# Patient Record
Sex: Male | Born: 1985 | Race: White | Hispanic: No | Marital: Married | Smoking: Never smoker
Health system: Southern US, Community
[De-identification: ages and names within clinical notes are randomized; demographics above are authoritative.]

## PROBLEM LIST (undated history)

## (undated) DIAGNOSIS — R569 Unspecified convulsions: Secondary | ICD-10-CM

---

## 2014-07-16 ENCOUNTER — Encounter (HOSPITAL_COMMUNITY): Payer: Self-pay | Admitting: Emergency Medicine

## 2014-07-16 ENCOUNTER — Emergency Department (HOSPITAL_COMMUNITY): Payer: Managed Care, Other (non HMO)

## 2014-07-16 ENCOUNTER — Emergency Department (HOSPITAL_COMMUNITY)
Admission: EM | Admit: 2014-07-16 | Discharge: 2014-07-16 | Disposition: A | Discharge status: Home / Self Care | Payer: Managed Care, Other (non HMO) | Attending: Emergency Medicine | Admitting: Emergency Medicine

## 2014-07-16 DIAGNOSIS — X58XXXA Exposure to other specified factors, initial encounter: Secondary | ICD-10-CM | POA: Diagnosis not present

## 2014-07-16 DIAGNOSIS — Y929 Unspecified place or not applicable: Secondary | ICD-10-CM | POA: Insufficient documentation

## 2014-07-16 DIAGNOSIS — Y999 Unspecified external cause status: Secondary | ICD-10-CM | POA: Diagnosis not present

## 2014-07-16 DIAGNOSIS — S335XXA Sprain of ligaments of lumbar spine, initial encounter: Secondary | ICD-10-CM

## 2014-07-16 DIAGNOSIS — Y939 Activity, unspecified: Secondary | ICD-10-CM | POA: Diagnosis not present

## 2014-07-16 DIAGNOSIS — Z79899 Other long term (current) drug therapy: Secondary | ICD-10-CM | POA: Diagnosis not present

## 2014-07-16 DIAGNOSIS — G40909 Epilepsy, unspecified, not intractable, without status epilepticus: Secondary | ICD-10-CM | POA: Diagnosis not present

## 2014-07-16 DIAGNOSIS — R52 Pain, unspecified: Secondary | ICD-10-CM

## 2014-07-16 DIAGNOSIS — S3992XA Unspecified injury of lower back, initial encounter: Secondary | ICD-10-CM | POA: Diagnosis present

## 2014-07-16 DIAGNOSIS — S339XXA Sprain of unspecified parts of lumbar spine and pelvis, initial encounter: Secondary | ICD-10-CM | POA: Insufficient documentation

## 2014-07-16 HISTORY — DX: Unspecified convulsions: R56.9

## 2014-07-16 MED ORDER — HYDROCODONE-ACETAMINOPHEN 5-325 MG PO TABS: 2.0000 | ORAL_TABLET | ORAL | Status: DC | PRN

## 2014-07-16 MED ORDER — ONDANSETRON 4 MG PO TBDP
4.0000 mg | ORAL_TABLET | Freq: Once | ORAL | Status: AC
  Administered 2014-07-16: 4 mg via ORAL
  Filled 2014-07-16: qty 1

## 2014-07-16 MED ORDER — KETOROLAC TROMETHAMINE 60 MG/2ML IM SOLN
60.0000 mg | Freq: Once | INTRAMUSCULAR | Status: AC
  Administered 2014-07-16: 60 mg via INTRAMUSCULAR
  Filled 2014-07-16: qty 2

## 2014-07-16 MED ORDER — IBUPROFEN 800 MG PO TABS: 800.0000 mg | ORAL_TABLET | Freq: Three times a day (TID) | ORAL | Status: AC

## 2014-07-16 MED ORDER — METHOCARBAMOL 500 MG PO TABS: 500.0000 mg | ORAL_TABLET | Freq: Two times a day (BID) | ORAL | Status: DC

## 2014-07-16 MED ORDER — HYDROMORPHONE HCL 1 MG/ML IJ SOLN
1.0000 mg | Freq: Once | INTRAMUSCULAR | Status: AC
  Administered 2014-07-16: 1 mg via INTRAMUSCULAR
  Filled 2014-07-16: qty 1

## 2014-07-16 NOTE — ED Provider Notes (Signed)
CSN: [id number]     Arrival date & time 07/16/14  1758 History  This chart was scribed for non-physician practitioner, Langston MaskerKaren Sofia, PA-C working with Jerelyn ScottMartha Linker, MD by Greggory StallionKayla Andersen, ED scribe. This patient was seen in room TR10C/TR10C and the patient's care was started at 6:44 PM.   Chief Complaint  Patient presents with  . Back Pain   The history is provided by the patient. No language interpreter was used.    HPI Comments: Frederick Rose is a 29 y.o. male who presents to the Emergency Department complaining of sudden onset lower back pain that started this morning after sneezing. Pain does not radiate into his legs. Movements worsen pain but standing relieves some of it. He has taken ibuprofen with no relief and used ice with some relief. Pt denies trouble breathing, abdominal pain, bowel or bladder incontinence, numbness or tingling, weakness. He is on keppra daily for epilepsy. Pt denies recent seizure or fall that could have caused his current back pain. He has had intermittent back pain since running track in high school but states it has never been this severe. Pt does not have a PCP.  Past Medical History  Diagnosis Date  . Seizures    History reviewed. No pertinent past surgical history. No family history on file. History  Substance Use Topics  . Smoking status: Never Smoker   . Smokeless tobacco: Not on file  . Alcohol Use: No    Review of Systems  Gastrointestinal: Negative for abdominal pain.  Genitourinary:       Negative for bowel or bladder incontinence.  Musculoskeletal: Positive for back pain.  Neurological: Negative for weakness and numbness.  All other systems reviewed and are negative.  Allergies  Lamictal  Home Medications   Prior to Admission medications   Medication Sig Start Date End Date Taking? Authorizing Provider  ibuprofen (ADVIL,MOTRIN) 200 MG tablet Take 1,000 mg by mouth every 6 (six) hours as needed.   Yes Historical Provider, MD   levETIRAcetam (KEPPRA) 750 MG tablet Take 750 mg by mouth 2 (two) times daily.   Yes Historical Provider, MD   BP 116/65 mmHg  Pulse 67  Temp(Src) 98.6 F (37 C)  Resp 16  Ht 5\' 6"  (1.676 m)  Wt 160 lb (72.576 kg)  BMI 25.84 kg/m2  SpO2 95%   Physical Exam  Constitutional: He is oriented to person, place, and time. He appears well-developed and well-nourished. No distress.  HENT:  Head: Normocephalic and atraumatic.  Eyes: Conjunctivae and EOM are normal.  Neck: Neck supple.  Cardiovascular: Normal rate.   Pulmonary/Chest: Effort normal. No respiratory distress.  Musculoskeletal: Normal range of motion.  Diffusely tender on lumbar spine. Pain with movement. Full ROM of lower extremities.   Neurological: He is alert and oriented to person, place, and time.  Skin: Skin is warm and dry.  Psychiatric: He has a normal mood and affect. His behavior is normal.  Nursing note and vitals reviewed.   ED Course  Procedures (including critical care time)  DIAGNOSTIC STUDIES: Oxygen Saturation is 95% on RA, normal by my interpretation.    COORDINATION OF CARE: 6:48 PM-Discussed treatment plan which includes pain medication and lumbar xray with pt at bedside and pt agreed to plan.   Labs Review Labs Reviewed - No data to display  Imaging Review Dg Lumbar Spine Complete  07/16/2014   CLINICAL DATA:  Sudden low back pain starting this morning after sneezing.  EXAM: LUMBAR SPINE - COMPLETE 4+ VIEW  COMPARISON:  None.  FINDINGS: There is no evidence of lumbar spine fracture. There is dextro curvature of the lumbar spine. Intervertebral disc spaces are maintained.  IMPRESSION: No acute fracture or dislocation.   Electronically Signed   By: Sherian Rein M.D.   On: 07/16/2014 19:33     EKG Interpretation None      MDM  Pt able to ambulate after torodol and pain medication.     Final diagnoses:  Pain  Lumbar sprain, initial encounter   Ibuprofen Hydrocodone robaxin  I  personally performed the services in this documentation, which was scribed in my presence.  The recorded information has been reviewed and considered.   Barnet Pall.  Lonia Skinner [place], PA-C 07/16/14 2116  Jerelyn Scott, MD 07/16/14 2130

## 2014-07-16 NOTE — ED Notes (Signed)
Pt reports that he coughed this morning and his back gave out on him. Pt c/o pain to lower back. sts when the pain comes on he colapses. No numbness, no loss of bowel or urine.

## 2014-07-16 NOTE — Discharge Instructions (Signed)
Back Pain, Adult Low back pain is very common. About 1 in 5 people have back pain.The cause of low back pain is rarely dangerous. The pain often gets better over time.About half of people with a sudden onset of back pain feel better in just 2 weeks. About 8 in 10 people feel better by 6 weeks.  CAUSES Some common causes of back pain include:  Strain of the muscles or ligaments supporting the spine.  Wear and tear (degeneration) of the spinal discs.  Arthritis.  Direct injury to the back. DIAGNOSIS Most of the time, the direct cause of low back pain is not known.However, back pain can be treated effectively even when the exact cause of the pain is unknown.Answering your caregiver's questions about your overall health and symptoms is one of the most accurate ways to make sure the cause of your pain is not dangerous. If your caregiver needs more information, he or she may order lab work or imaging tests (X-rays or MRIs).However, even if imaging tests show changes in your back, this usually does not require surgery. HOME CARE INSTRUCTIONS For many people, back pain returns.Since low back pain is rarely dangerous, it is often a condition that people can learn to manageon their own.   Remain active. It is stressful on the back to sit or stand in one place. Do not sit, drive, or stand in one place for more than 30 minutes at a time. Take short walks on level surfaces as soon as pain allows.Try to increase the length of time you walk each day.  Do not stay in bed.Resting more than 1 or 2 days can delay your recovery.  Do not avoid exercise or work.Your body is made to move.It is not dangerous to be active, even though your back may hurt.Your back will likely heal faster if you return to being active before your pain is gone.  Pay attention to your body when you bend and lift. Many people have less discomfortwhen lifting if they bend their knees, keep the load close to their bodies,and  avoid twisting. Often, the most comfortable positions are those that put less stress on your recovering back.  Find a comfortable position to sleep. Use a firm mattress and lie on your side with your knees slightly bent. If you lie on your back, put a pillow under your knees.  Only take over-the-counter or prescription medicines as directed by your caregiver. Over-the-counter medicines to reduce pain and inflammation are often the most helpful.Your caregiver may prescribe muscle relaxant drugs.These medicines help dull your pain so you can more quickly return to your normal activities and healthy exercise.  Put ice on the injured area.  Put ice in a plastic bag.  Place a towel between your skin and the bag.  Leave the ice on for 15-20 minutes, 03-04 times a day for the first 2 to 3 days. After that, ice and heat may be alternated to reduce pain and spasms.  Ask your caregiver about trying back exercises and gentle massage. This may be of some benefit.  Avoid feeling anxious or stressed.Stress increases muscle tension and can worsen back pain.It is important to recognize when you are anxious or stressed and learn ways to manage it.Exercise is a great option. SEEK MEDICAL CARE IF:  You have pain that is not relieved with rest or medicine.  You have pain that does not improve in 1 week.  You have new symptoms.  You are generally not feeling well. SEEK   IMMEDIATE MEDICAL CARE IF:   You have pain that radiates from your back into your legs.  You develop new bowel or bladder control problems.  You have unusual weakness or numbness in your arms or legs.  You develop nausea or vomiting.  You develop abdominal pain.  You feel faint. Document Released: 04/27/2005 Document Revised: 10/27/2011 Document Reviewed: 08/29/2013 Exit[employer] Patient Information 2015 [employer]. This information is not intended to replace advice given to you by your health care provider. Make sure you  discuss any questions you have with your health care provider.  

## 2014-08-09 ENCOUNTER — Ambulatory Visit (INDEPENDENT_AMBULATORY_CARE_PROVIDER_SITE_OTHER): Payer: Managed Care, Other (non HMO) | Admitting: Neurology

## 2014-08-09 ENCOUNTER — Encounter: Payer: Self-pay | Admitting: Neurology

## 2014-08-09 VITALS — BP 120/70 | HR 68 | Resp 16 | Ht 66.25 in | Wt 154.0 lb

## 2014-08-09 DIAGNOSIS — F329 Major depressive disorder, single episode, unspecified: Secondary | ICD-10-CM | POA: Insufficient documentation

## 2014-08-09 DIAGNOSIS — G40309 Generalized idiopathic epilepsy and epileptic syndromes, not intractable, without status epilepticus: Secondary | ICD-10-CM | POA: Diagnosis not present

## 2014-08-09 DIAGNOSIS — F32A Depression, unspecified: Secondary | ICD-10-CM

## 2014-08-09 MED ORDER — LEVETIRACETAM 750 MG PO TABS: 750.0000 mg | ORAL_TABLET | Freq: Two times a day (BID) | ORAL | Status: AC

## 2014-08-09 NOTE — Progress Notes (Signed)
NEUROLOGY CONSULTATION NOTE  Frederick Rose MRN: [id number] DOB: June 27, 1985  Referring provider: Dr. Mosetta Pigeon Primary care provider: none  Reason for consult:  Establish care for seizures  Dear Dr Carmelina Paddock:  Thank you for your kind referral of Frederick Rose for consultation of the above symptoms. Although his history is well known to you, please allow me to reiterate it for the purpose of our medical record. Records and images were personally reviewed where available.  HISTORY OF PRESENT ILLNESS: This is a pleasant 29 year old right-handed man presenting to establish care for his seizures. He moved to [place] a year ago from [place]. Records from his previous physician were reviewed. He had his first seizure at age 15, he woke up on the floor. He was living in [place] at that time, per records MRI and EEG were normal and since it was an isolated event, he was not started on medication. Since then, he had approximately 5 or 6 generalized tonic=clonic seizures, last seizure 05/08/11. He had been up late the night prior playing video games. He denies any warning symptoms, he is nauseated and confused after. He initially did not have any neurological follow-up for 3 years until he moved to [place] and was started on Keppra in the ER. He also described near-daily tics with brief jerks and interruptions in his train of thought, almost like he is falling asleep. He would be reading something, which sparks a chain of thought, requiring him to re-read the paragraph. Since starting the Keppra, he denied any further similar symptoms.  He denies any staring or unresponsive episodes. He tells me today that he did not have any visible body jerks. He has noticed seizures occur during times where he requires high levels of concentration. He had mood swings a month after starting Keppra, and was tried on Lamictal, which caused a rash. This was stopped, Keppra restarted, and he reports that he is now well-adapted  to it and likes the Keppra, with no side effects. He takes Levetiracetam  BID.  He denies any headaches, dizziness, diplopia, dysarthria, dysphagia, neck pain, focal numbness/tingling/weakness, bowel/bladder dysfunction. He has some back pain. He denies any olfactory/gustatory hallucinations, deja vu, rising epigastric sensation. He has been dealing with short-term memory problems, inability to concentrate, apathy, and lack of motivation. He had been to marriage counseling with his wife and was told to have a neuropsychological evaluation. He tells me that he feels like things would be different if he had motivation, he is passionate but wishes he was more naturally inclined to do certain things. He can go hours playing video games, this affects his marriage and job.   Epilepsy Risk Factors:  He had a normal birth and early development.  There is no history of febrile convulsions, CNS infections such as meningitis/encephalitis, significant traumatic brain injury, neurosurgical procedures, or family history of seizures.  Prior AEDs: Lamictal Laboratory Data: Keppra level 04/06/12 18 EEGs, MRI: none available for review, normal per patient  PAST MEDICAL HISTORY: Past Medical History  Diagnosis Date  . Seizures     PAST SURGICAL HISTORY: No past surgical history on file.  MEDICATIONS: Current Outpatient Prescriptions on File Prior to Visit  Medication Sig Dispense Refill  . ibuprofen (ADVIL,MOTRIN) 800 MG tablet Take 1 tablet (800 mg total) by mouth 3 (three) times daily. 21 tablet 0  . levETIRAcetam (KEPPRA) 750 MG tablet Take 750 mg by mouth 2 (two) times daily.     No current facility-administered medications on file prior  to visit.    ALLERGIES: Allergies  Allergen Reactions  . Lamictal [Lamotrigine] Rash    FAMILY HISTORY: Family History  Problem Relation Age of Onset  . Cancer Maternal Grandmother   . Heart disease Maternal Grandfather   . Hypertension Father   .  Cancer Father   . Heart disease Paternal Grandfather     SOCIAL HISTORY: History   Social History  . Marital Status: Married    Spouse Name: N/A  . Number of Children: N/A  . Years of Education: N/A   Occupational History  . Art Curator    Social History Main Topics  . Smoking status: Never Smoker   . Smokeless tobacco: Not on file  . Alcohol Use: 0.0 oz/week    0 Standard drinks or equivalent per week     Comment: occasional  . Drug Use: No  . Sexual Activity: Not on file   Other Topics Concern  . Not on file   Social History Narrative    REVIEW OF SYSTEMS: Constitutional: No fevers, chills, or sweats, no generalized fatigue, change in appetite Eyes: No visual changes, double vision, eye pain Ear, nose and throat: No hearing loss, ear pain, nasal congestion, sore throat Cardiovascular: No chest pain, palpitations Respiratory:  No shortness of breath at rest or with exertion, wheezes GastrointestinaI: No nausea, vomiting, diarrhea, abdominal pain, fecal incontinence Genitourinary:  No dysuria, urinary retention or frequency Musculoskeletal:  No neck pain, back pain Integumentary: No rash, pruritus, skin lesions Neurological: as above Psychiatric: No depression, insomnia, anxiety Endocrine: No palpitations, fatigue, diaphoresis, mood swings, change in appetite, change in weight, increased thirst Hematologic/Lymphatic:  No anemia, purpura, petechiae. Allergic/Immunologic: no itchy/runny eyes, nasal congestion, recent allergic reactions, rashes  PHYSICAL EXAM: Filed Vitals:   08/09/14 0909  BP: 120/70  Pulse: 68  Resp: 16   General: No acute distress Head:  Normocephalic/atraumatic Eyes: Fundoscopic exam shows bilateral sharp discs, no vessel changes, exudates, or hemorrhages Neck: supple, no paraspinal tenderness, full range of motion Back: No paraspinal tenderness Heart: regular rate and rhythm Lungs: Clear to auscultation bilaterally. Vascular: No carotid  bruits. Skin/Extremities: No rash, no edema Neurological Exam: Mental status: alert and oriented to person, place, and time, no dysarthria or aphasia, Fund of knowledge is appropriate.  Recent and remote memory are intact.  Attention and concentration are normal.    Able to name objects and repeat phrases. Cranial nerves: CN I: not tested CN II: pupils equal, round and reactive to light, visual fields intact, fundi unremarkable. CN III, IV, VI:  full range of motion, no nystagmus, no ptosis CN V: facial sensation intact CN VII: upper and lower face symmetric CN VIII: hearing intact to finger rub CN IX, X: gag intact, uvula midline CN XI: sternocleidomastoid and trapezius muscles intact CN XII: tongue midline Bulk & Tone: normal, no fasciculations. Motor: 5/5 throughout with no pronator drift. Sensation: intact to light touch, cold, pin, vibration and joint position sense.  No extinction to double simultaneous stimulation.  Romberg test negative Deep Tendon Reflexes: +2 throughout, no ankle clonus Plantar responses: downgoing bilaterally Cerebellar: no incoordination on finger to nose, heel to shin. No dysdiadochokinesia Gait: narrow-based and steady, able to tandem walk adequately. Tremor: none  IMPRESSION: This is a pleasant 29 year old right-handed man with a history of recurrent generalized tonic-clonic seizures. He also reports episodes of loss of train of thought and jerks (not clearly visible), suggestive of primary generalized epilepsy. Per patient[place]. He is currently on Keppra  BID with no convulsions since 2012. His main concern today is the apathy and lack of motivation. This has caused problems with his marriage and job. We discussed referral to a psychologist, and if symptoms persist, we will consider neuropsychological evaluation recommended to him in the past. [place] driving laws were discussed with the patient, and he knows to stop driving  after a seizure, until 6 months seizure-free. We discussed avoidance of seizure triggers, including missed medication, alcohol, and sleep deprivation. He will follow-up in 6 months or earlier if needed.   Thank you for allowing me to participate in the care of this patient. Please do not hesitate to call for any questions or concerns.   Patrcia Dolly, M.D.

## 2014-08-09 NOTE — Patient Instructions (Addendum)
1. Continue Levetiracetam 750mg  twice a day 2. Refer to Psychology for evaluation and treatment 3. Follow-up in 6 months, call for any changes  Seizure Precautions: 1. If medication has been prescribed for you to prevent seizures, take it exactly as directed.  Do not stop taking the medicine without talking to your doctor first, even if you have not had a seizure in a long time.   2. Avoid activities in which a seizure would cause danger to yourself or to others.  Don't operate dangerous machinery, swim alone, or climb in high or dangerous places, such as on ladders, roofs, or girders.  Do not drive unless your doctor says you may.  3. If you have any warning that you may have a seizure, lay down in a safe place where you can't hurt yourself.    4.  No driving for 6 months from last seizure, [place] [facility] [place].   Please refer to the following link on the [employer]ile[employer] of [employer]'s website for more information: http://www.epilepsyfoundation.org/answerplace/Social/driving/drivingu.cfm   5.  Maintain good sleep hygiene.  6.  Contact your doctor if you have any problems that may be related to the medicine you are taking.  7.  Call 911 and bring the patient back to the ED if:        A.  The seizure lasts longer than 5 minutes.       B.  The patient doesn't awaken shortly after the seizure  C.  The patient has new problems such as difficulty seeing, speaking or moving  D.  The patient was injured during the seizure  E.  The patient has a temperature over 102 F (39C)  F.  The patient vomited and now is having trouble breathing

## 2014-08-15 ENCOUNTER — Encounter: Payer: Self-pay | Admitting: Neurology

## 2014-12-19 ENCOUNTER — Ambulatory Visit: Payer: Managed Care, Other (non HMO) | Admitting: Neurology

## 2014-12-21 ENCOUNTER — Encounter: Payer: Self-pay | Admitting: *Deleted

## 2014-12-21 NOTE — Progress Notes (Signed)
No show letter sent for missed appointment 12/19/2014

## 2017-01-02 IMAGING — DX DG LUMBAR SPINE COMPLETE 4+V
5 series · 5 of 5 positions shown · non-contrast
Comparison: None.

CLINICAL DATA: Sudden low back pain starting this morning after
sneezing.

EXAM:
LUMBAR SPINE - COMPLETE 4+ VIEW

[l-spine ap]
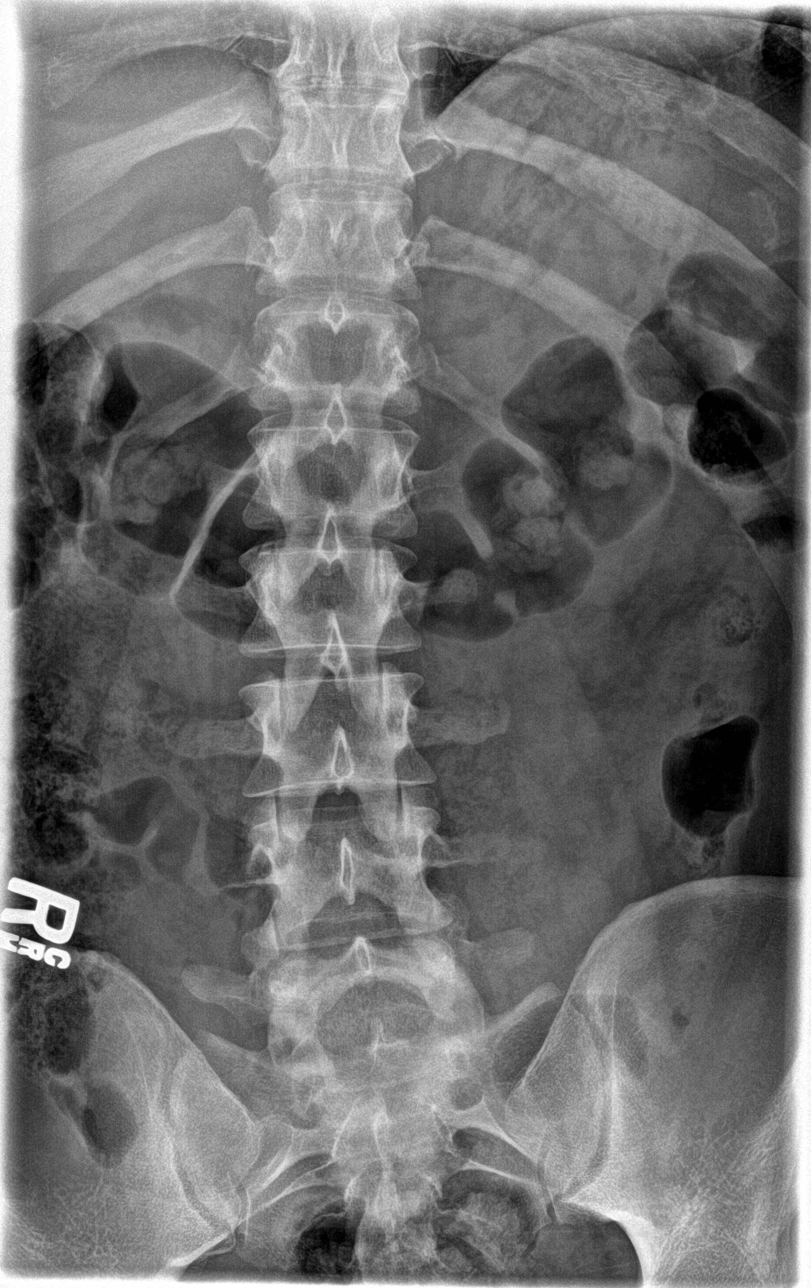

[l-spine obl (1 of 2)]
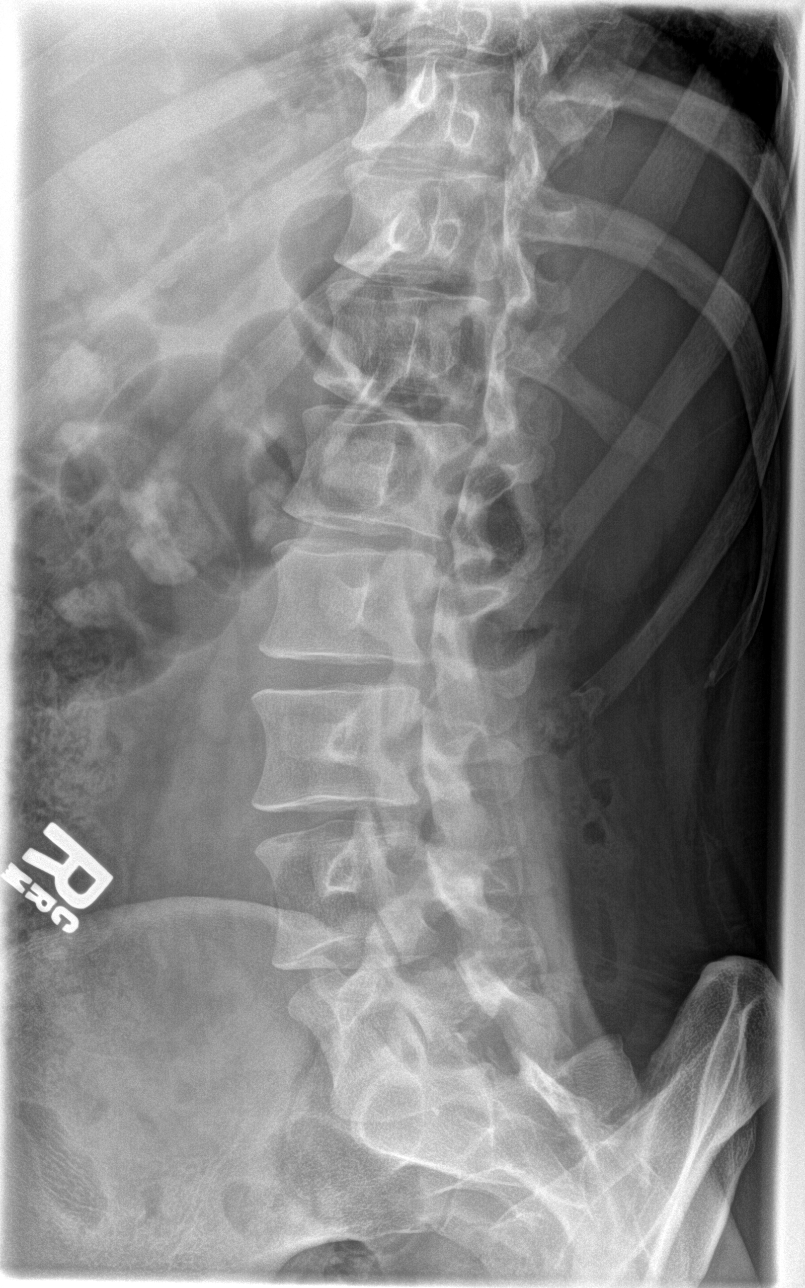

[l-spine obl (2 of 2)]
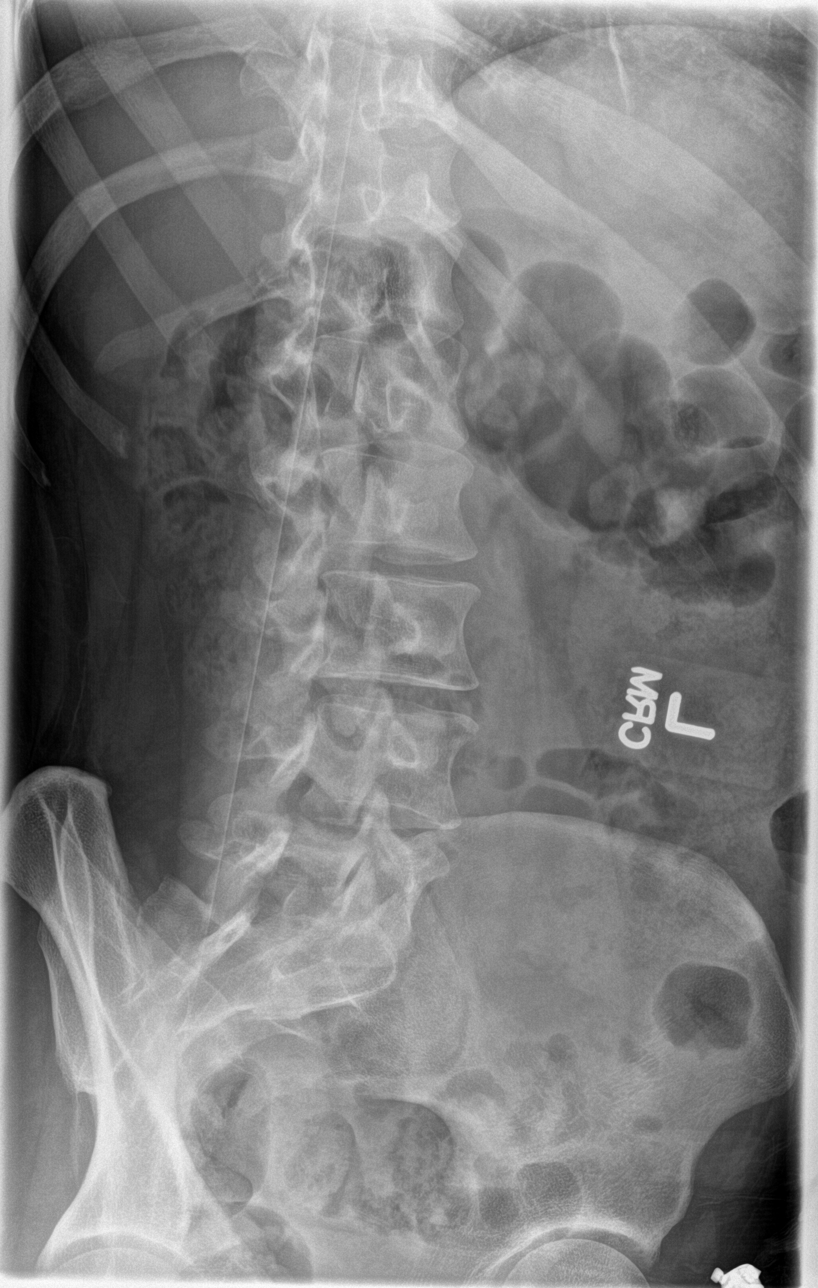

[l-spine lat]
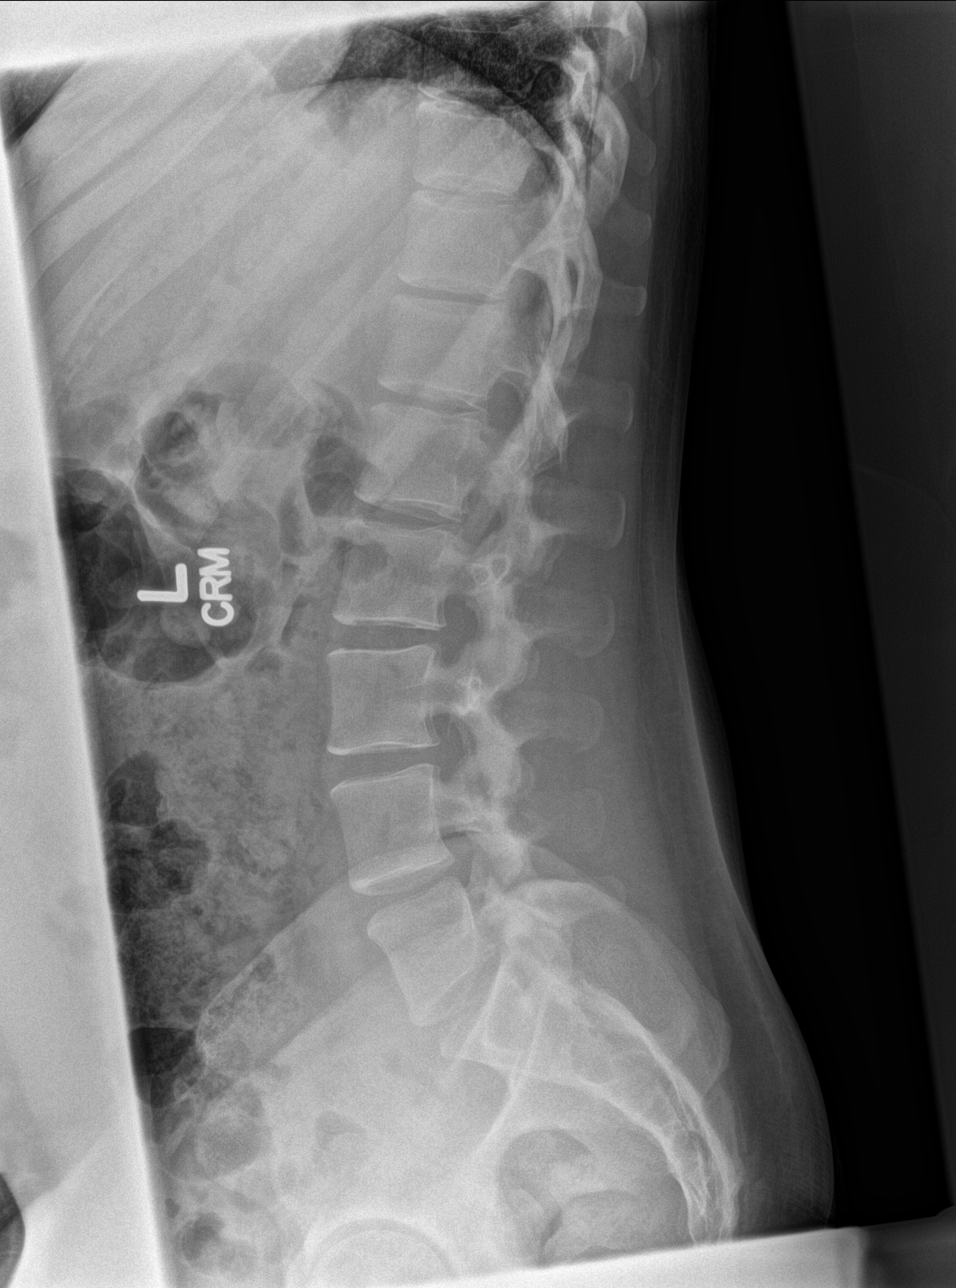

[l-spine spot]
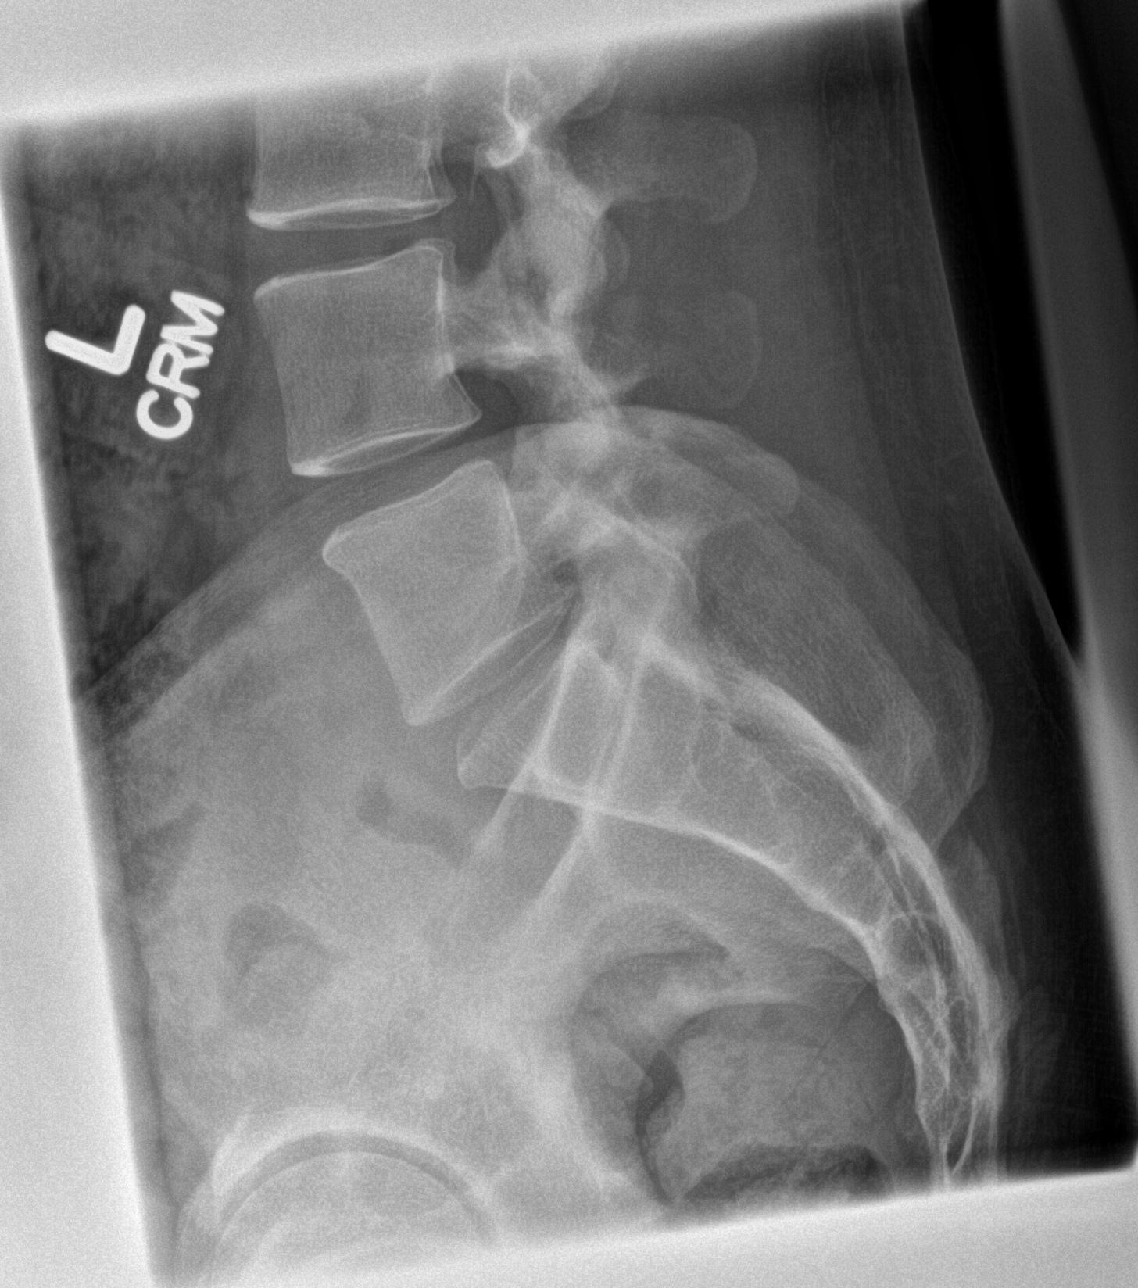

[5 of 5 positions shown; findings below may reference images not displayed]

FINDINGS: There is no evidence of lumbar spine fracture. There is dextro
curvature of the lumbar spine. Intervertebral disc spaces are
maintained.
IMPRESSION: No acute fracture or dislocation.
